# Patient Record
Sex: Male | Born: 1995
Health system: Southern US, Community
[De-identification: ages and names within clinical notes are randomized; demographics above are authoritative.]

---
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
## 2020-04-10 ENCOUNTER — Encounter (HOSPITAL_COMMUNITY): Payer: Self-pay | Admitting: Pediatrics

## 2020-04-10 ENCOUNTER — Emergency Department (HOSPITAL_COMMUNITY)
Admission: EM | Admit: 2020-04-10 | Discharge: 2020-04-10 | Disposition: A | Discharge status: Home / Self Care | Payer: BC Managed Care – PPO | Attending: Emergency Medicine | Admitting: Emergency Medicine

## 2020-04-10 ENCOUNTER — Emergency Department (HOSPITAL_COMMUNITY): Payer: BC Managed Care – PPO

## 2020-04-10 ENCOUNTER — Other Ambulatory Visit: Payer: Self-pay

## 2020-04-10 DIAGNOSIS — R109 Unspecified abdominal pain: Secondary | ICD-10-CM | POA: Diagnosis not present

## 2020-04-10 DIAGNOSIS — R112 Nausea with vomiting, unspecified: Secondary | ICD-10-CM | POA: Insufficient documentation

## 2020-04-10 DIAGNOSIS — R1032 Left lower quadrant pain: Secondary | ICD-10-CM | POA: Diagnosis not present

## 2020-04-10 DIAGNOSIS — N200 Calculus of kidney: Secondary | ICD-10-CM | POA: Diagnosis not present

## 2020-04-10 LAB — CBC
HCT: 44.5 % (ref 39.0–52.0)
Hemoglobin: 14.9 g/dL (ref 13.0–17.0)
MCH: 29.6 pg (ref 26.0–34.0)
MCHC: 33.5 g/dL (ref 30.0–36.0)
MCV: 88.3 fL (ref 80.0–100.0)
Platelets: 266 10*3/uL (ref 150–400)
RBC: 5.04 MIL/uL (ref 4.22–5.81)
RDW: 12.1 % (ref 11.5–15.5)
WBC: 9.9 10*3/uL (ref 4.0–10.5)
nRBC: 0 % (ref 0.0–0.2)

## 2020-04-10 LAB — URINALYSIS, ROUTINE W REFLEX MICROSCOPIC
Bacteria, UA: NONE SEEN
Bilirubin Urine: NEGATIVE
Glucose, UA: 150 mg/dL — AB
Ketones, ur: 20 mg/dL — AB
Leukocytes,Ua: NEGATIVE
Nitrite: NEGATIVE
Protein, ur: NEGATIVE mg/dL
Specific Gravity, Urine: 1.021 (ref 1.005–1.030)
pH: 7 (ref 5.0–8.0)

## 2020-04-10 LAB — COMPREHENSIVE METABOLIC PANEL
ALT: 21 U/L (ref 0–44)
AST: 21 U/L (ref 15–41)
Albumin: 4.5 g/dL (ref 3.5–5.0)
Alkaline Phosphatase: 49 U/L (ref 38–126)
Anion gap: 14 (ref 5–15)
BUN: 12 mg/dL (ref 6–20)
CO2: 18 mmol/L — ABNORMAL LOW (ref 22–32)
Calcium: 9.4 mg/dL (ref 8.9–10.3)
Chloride: 107 mmol/L (ref 98–111)
Creatinine, Ser: 1.24 mg/dL (ref 0.61–1.24)
GFR calc Af Amer: >60 mL/min (ref >60)
GFR calc non Af Amer: >60 mL/min (ref >60)
Glucose, Bld: 163 mg/dL — ABNORMAL HIGH (ref 70–99)
Potassium: 3.7 mmol/L (ref 3.5–5.1)
Sodium: 139 mmol/L (ref 135–145)
Total Bilirubin: 0.7 mg/dL (ref 0.3–1.2)
Total Protein: 7 g/dL (ref 6.5–8.1)

## 2020-04-10 LAB — LIPASE, BLOOD: Lipase: 24 U/L (ref 11–51)

## 2020-04-10 MED ORDER — KETOROLAC TROMETHAMINE 30 MG/ML IJ SOLN
30.0000 mg | Freq: Once | INTRAMUSCULAR | Status: AC
  Administered 2020-04-10: 30 mg via INTRAVENOUS
  Filled 2020-04-10: qty 1

## 2020-04-10 MED ORDER — NAPROXEN 500 MG PO TABS: 500.0000 mg | ORAL_TABLET | Freq: Two times a day (BID) | ORAL | 0 refills | Status: DC

## 2020-04-10 MED ORDER — NAPROXEN 500 MG PO TABS: 500.0000 mg | ORAL_TABLET | Freq: Two times a day (BID) | ORAL | 0 refills | Status: AC

## 2020-04-10 MED ORDER — ONDANSETRON 8 MG PO TBDP: 8.0000 mg | ORAL_TABLET | Freq: Three times a day (TID) | ORAL | 0 refills | Status: DC | PRN

## 2020-04-10 MED ORDER — ONDANSETRON HCL 4 MG/2ML IJ SOLN
4.0000 mg | Freq: Once | INTRAMUSCULAR | Status: AC
  Administered 2020-04-10: 4 mg via INTRAVENOUS
  Filled 2020-04-10: qty 2

## 2020-04-10 MED ORDER — SODIUM CHLORIDE 0.9 % IV SOLN: INTRAVENOUS | Status: DC

## 2020-04-10 MED ORDER — HYDROMORPHONE HCL 1 MG/ML IJ SOLN
1.0000 mg | INTRAMUSCULAR | Status: AC | PRN
  Administered 2020-04-10 (×2): 1 mg via INTRAVENOUS
  Filled 2020-04-10 (×2): qty 1

## 2020-04-10 MED ORDER — SODIUM CHLORIDE 0.9 % IV BOLUS
1000.0000 mL | Freq: Once | INTRAVENOUS | Status: AC
  Administered 2020-04-10: 1000 mL via INTRAVENOUS

## 2020-04-10 MED ORDER — SODIUM CHLORIDE 0.9% FLUSH: 3.0000 mL | Freq: Once | INTRAVENOUS | Status: DC

## 2020-04-10 MED ORDER — HYDROCODONE-ACETAMINOPHEN 5-325 MG PO TABS: 1.0000 | ORAL_TABLET | ORAL | 0 refills | Status: DC | PRN

## 2020-04-10 MED ORDER — HYDROCODONE-ACETAMINOPHEN 5-325 MG PO TABS: 1.0000 | ORAL_TABLET | ORAL | 0 refills | Status: AC | PRN

## 2020-04-10 MED ORDER — ONDANSETRON 8 MG PO TBDP: 8.0000 mg | ORAL_TABLET | Freq: Three times a day (TID) | ORAL | 0 refills | Status: AC | PRN

## 2020-04-10 NOTE — ED Triage Notes (Signed)
C/o severe abdominal pain sudden onset around 0700; along w/ NV. Denies significant medical problem.

## 2020-04-10 NOTE — ED Provider Notes (Signed)
[name] EMERGENCY DEPARTMENT Provider Note   CSN: [id number] Arrival date & time: 04/10/20  0754     History Chief Complaint  Patient presents with  . Abdominal Pain    [name] is a 24 y.o. male.  HPI   Patient presents to the ED for evaluation of severe abdominal pain.  Patient states that the symptoms started suddenly at about 7 AM.  He is having severe pain in the left side of his abdomen and left flank area.  He denies any radiation to his groin.  Patient has felt chilled and diaphoretic.  The pain is a 12 out of 10.  He is having nausea and vomiting.  He denies any hematemesis.  No blood in his stool.  Patient denies any history of abdominal pain in the past.  There is a family history of kidney stones.  History reviewed. No pertinent past medical history.  There are no problems to display for this patient.   History reviewed. No pertinent surgical history.     No family history on file.  Social History   Tobacco Use  . Smoking status: Not on file  Substance Use Topics  . Alcohol use: Not on file  . Drug use: Not on file    Home Medications Prior to Admission medications   Medication Sig Start Date End Date Taking? Authorizing Provider  HYDROcodone-acetaminophen (NORCO/VICODIN) 5-325 MG tablet Take 1 tablet by mouth every 4 (four) hours as needed. 04/10/20   Linwood Dibbles, MD  naproxen (NAPROSYN) 500 MG tablet Take 1 tablet (500 mg total) by mouth 2 (two) times daily with a meal. As needed for pain 04/10/20   Linwood Dibbles, MD  ondansetron (ZOFRAN ODT) 8 MG disintegrating tablet Take 1 tablet (8 mg total) by mouth every 8 (eight) hours as needed for nausea or vomiting. 04/10/20   Linwood Dibbles, MD    Allergies    Patient has no known allergies.  Review of Systems   Review of Systems  All other systems reviewed and are negative.   Physical Exam Updated Vital Signs BP 131/69   Pulse 64   Temp 98.4 F (36.9 C) (Oral)   Resp 16   Ht  1.676 m (5\' 6" )   Wt 83.9 kg   SpO2 100%   BMI 29.86 kg/m   Physical Exam Vitals and nursing note reviewed.  Constitutional:      General: He is in acute distress.     Appearance: He is well-developed. He is ill-appearing.  HENT:     Head: Normocephalic and atraumatic.     Right Ear: External ear normal.     Left Ear: External ear normal.  Eyes:     General: No scleral icterus.       Right eye: No discharge.        Left eye: No discharge.     Conjunctiva/sclera: Conjunctivae normal.  Neck:     Trachea: No tracheal deviation.  Cardiovascular:     Rate and Rhythm: Normal rate and regular rhythm.  Pulmonary:     Effort: Pulmonary effort is normal. No respiratory distress.     Breath sounds: Normal breath sounds. No stridor. No wheezing or rales.  Abdominal:     General: Bowel sounds are normal. There is no distension.     Palpations: Abdomen is soft.     Tenderness: There is abdominal tenderness in the left lower quadrant. There is no guarding or rebound.     Hernia: No  hernia is present.  Genitourinary:    Comments: No inguinal or scrotal tenderness Musculoskeletal:        General: No tenderness.     Cervical back: Neck supple.  Skin:    General: Skin is warm and dry.     Findings: No rash.  Neurological:     Mental Status: He is alert.     Cranial Nerves: No cranial nerve deficit (no facial droop, extraocular movements intact, no slurred speech).     Sensory: No sensory deficit.     Motor: No abnormal muscle tone or seizure activity.     Coordination: Coordination normal.     ED Results / Procedures / Treatments   Labs (all labs ordered are listed, but only abnormal results are displayed) Labs Reviewed  COMPREHENSIVE METABOLIC PANEL - Abnormal; Notable for the following components:      Result Value   CO2 18 (*)    Glucose, Bld 163 (*)    All other components within normal limits  URINALYSIS, ROUTINE W REFLEX MICROSCOPIC - Abnormal; Notable for the following  components:   Glucose, UA 150 (*)    Hgb urine dipstick SMALL (*)    Ketones, ur 20 (*)    All other components within normal limits  LIPASE, BLOOD  CBC    EKG None  Radiology CT RENAL STONE STUDY  Result Date: 04/10/2020 CLINICAL DATA:  Left flank pain EXAM: CT ABDOMEN AND PELVIS WITHOUT CONTRAST TECHNIQUE: Multidetector CT imaging of the abdomen and pelvis was performed following the standard protocol without IV contrast. COMPARISON:  None. FINDINGS: Lower chest: No acute abnormality. Hepatobiliary: No focal liver abnormality is seen. No gallstones, gallbladder wall thickening, or biliary dilatation. Pancreas: Unremarkable. No pancreatic ductal dilatation or surrounding inflammatory changes. Spleen: Normal in size without focal abnormality. Adrenals/Urinary Tract: Adrenal glands are within normal limits. Right kidney shows no renal calculi or obstructive changes. The bladder is partially distended. The left kidney demonstrates mild hydronephrosis and hydroureter which extends to the level of the left UVJ. A small 2-3 mm stone is noted at the left UVJ causing the obstructive change. Stomach/Bowel: The appendix is well visualized and within normal limits. No obstructive or inflammatory changes of the colon are seen. Small bowel and stomach appear within normal limits. Vascular/Lymphatic: No significant vascular findings are present. No enlarged abdominal or pelvic lymph nodes. Reproductive: Prostate is unremarkable. Other: No abdominal wall hernia or abnormality. No abdominopelvic ascites. Musculoskeletal: No acute or significant osseous findings. IMPRESSION: 2-3 mm left UVJ stone with mild hydronephrosis. No other focal abnormality is noted. Electronically Signed   By: Alcide Clever M.D.   On: 04/10/2020 11:01    Procedures Procedures (including critical care time)  Medications Ordered in ED Medications  sodium chloride flush (NS) 0.9 % injection 3 mL (3 mLs Intravenous Not Given 04/10/20 0923)   sodium chloride 0.9 % bolus 1,000 mL (0 mLs Intravenous Stopped 04/10/20 1115)    And  0.9 %  sodium chloride infusion ( Intravenous Stopped 04/10/20 1147)  HYDROmorphone (DILAUDID) injection 1 mg (1 mg Intravenous Given 04/10/20 0957)  ondansetron (ZOFRAN) injection 4 mg (4 mg Intravenous Given 04/10/20 0922)  ketorolac (TORADOL) 30 MG/ML injection 30 mg (30 mg Intravenous Given 04/10/20 1119)    ED Course  I have reviewed the triage vital signs and the nursing notes.  Pertinent labs & imaging results that were available during my care of the patient were reviewed by me and considered in my medical decision making (see  chart for details).    MDM Rules/Calculators/A&P                          Patient presented to ED with acute abdominal and flank pain.  Symptoms were suggestive of ureteral colic.  Laboratory tests were reassuring.  CT scan confirmed a ureteral stone.  Patient symptoms improved with hydromorphone as well as ketorolac.  Plan on discharge home with outpatient follow-up with urology Final Clinical Impression(s) / ED Diagnoses Final diagnoses:  Kidney stone    Rx / DC Orders ED Discharge Orders         Ordered    HYDROcodone-acetaminophen (NORCO/VICODIN) 5-325 MG tablet  Every 4 hours PRN,   Status:  Discontinued     Reprint     04/10/20 1416    naproxen (NAPROSYN) 500 MG tablet  2 times daily with meals,   Status:  Discontinued     Reprint     04/10/20 1416    ondansetron (ZOFRAN ODT) 8 MG disintegrating tablet  Every 8 hours PRN,   Status:  Discontinued     Reprint     04/10/20 1416    HYDROcodone-acetaminophen (NORCO/VICODIN) 5-325 MG tablet  Every 4 hours PRN     Discontinue  Reprint     04/10/20 1417    naproxen (NAPROSYN) 500 MG tablet  2 times daily with meals     Discontinue  Reprint     04/10/20 1417    ondansetron (ZOFRAN ODT) 8 MG disintegrating tablet  Every 8 hours PRN     Discontinue  Reprint     04/10/20 1417           Dorie Rank, MD 04/10/20  1606

## 2020-04-10 NOTE — ED Notes (Signed)
Patient verbalizes understanding of discharge instructions. Opportunity for questioning and answers were provided. Pt discharged from ED. 

## 2020-04-10 NOTE — Discharge Instructions (Addendum)
Use the urine strainer.  Follow up with the urologist.  Take the medications as needed for pain

## 2020-06-15 DIAGNOSIS — Z13220 Encounter for screening for lipoid disorders: Secondary | ICD-10-CM | POA: Diagnosis not present

## 2020-06-15 DIAGNOSIS — Z8669 Personal history of other diseases of the nervous system and sense organs: Secondary | ICD-10-CM | POA: Diagnosis not present

## 2020-06-15 DIAGNOSIS — F17200 Nicotine dependence, unspecified, uncomplicated: Secondary | ICD-10-CM | POA: Diagnosis not present

## 2020-06-15 DIAGNOSIS — Z87442 Personal history of urinary calculi: Secondary | ICD-10-CM | POA: Diagnosis not present
